# Patient Record
Sex: Male | Born: 2005 | Race: Black or African American | Hispanic: No | Marital: Single | Smoking: Never smoker
Health system: Southern US, Community
[De-identification: ages and names within clinical notes are randomized; demographics above are authoritative.]

---
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
## 2023-10-18 IMAGING — DX DG ANKLE COMPLETE 3+V*L*
3 series · 3 of 3 positions shown · non-contrast
Comparison: Left ankle radiograph 07/08/2015

CLINICAL DATA: Injury, pain. Injured left ankle at work last night,
slipped on water.

EXAM:
LEFT ANKLE COMPLETE - 3+ VIEW

[ankle ap]
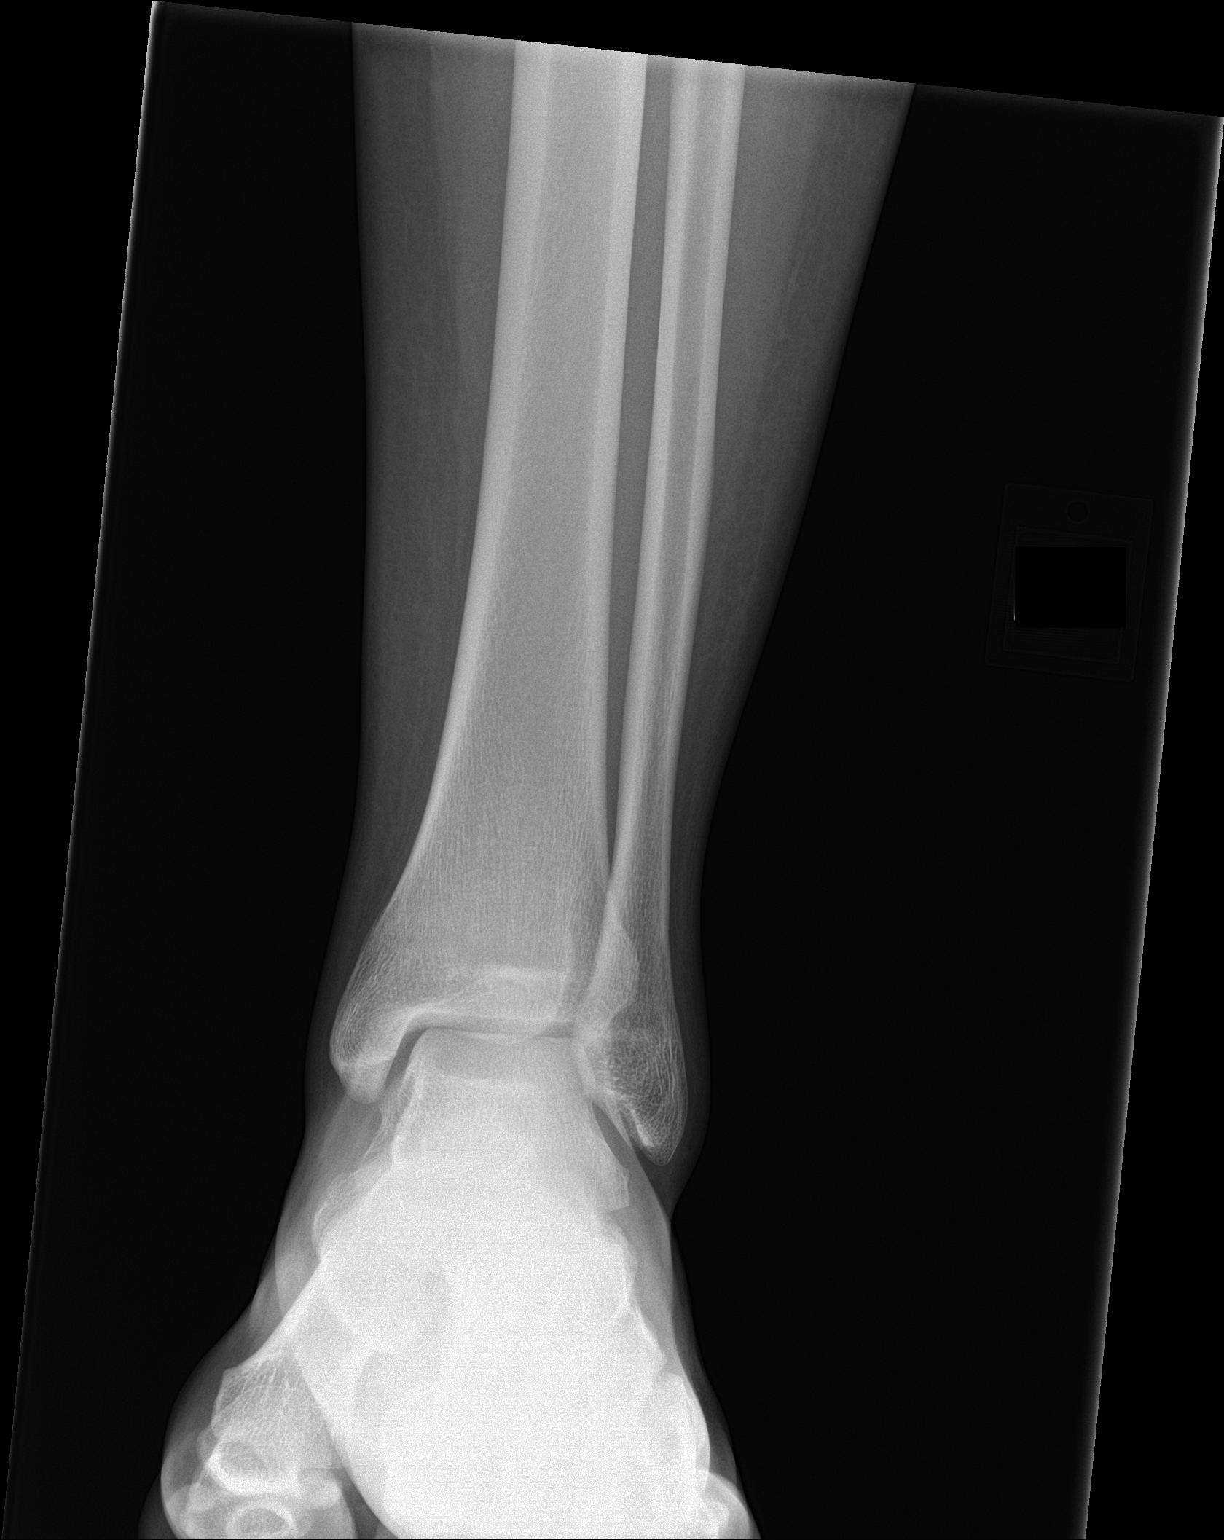

[ankle obl]
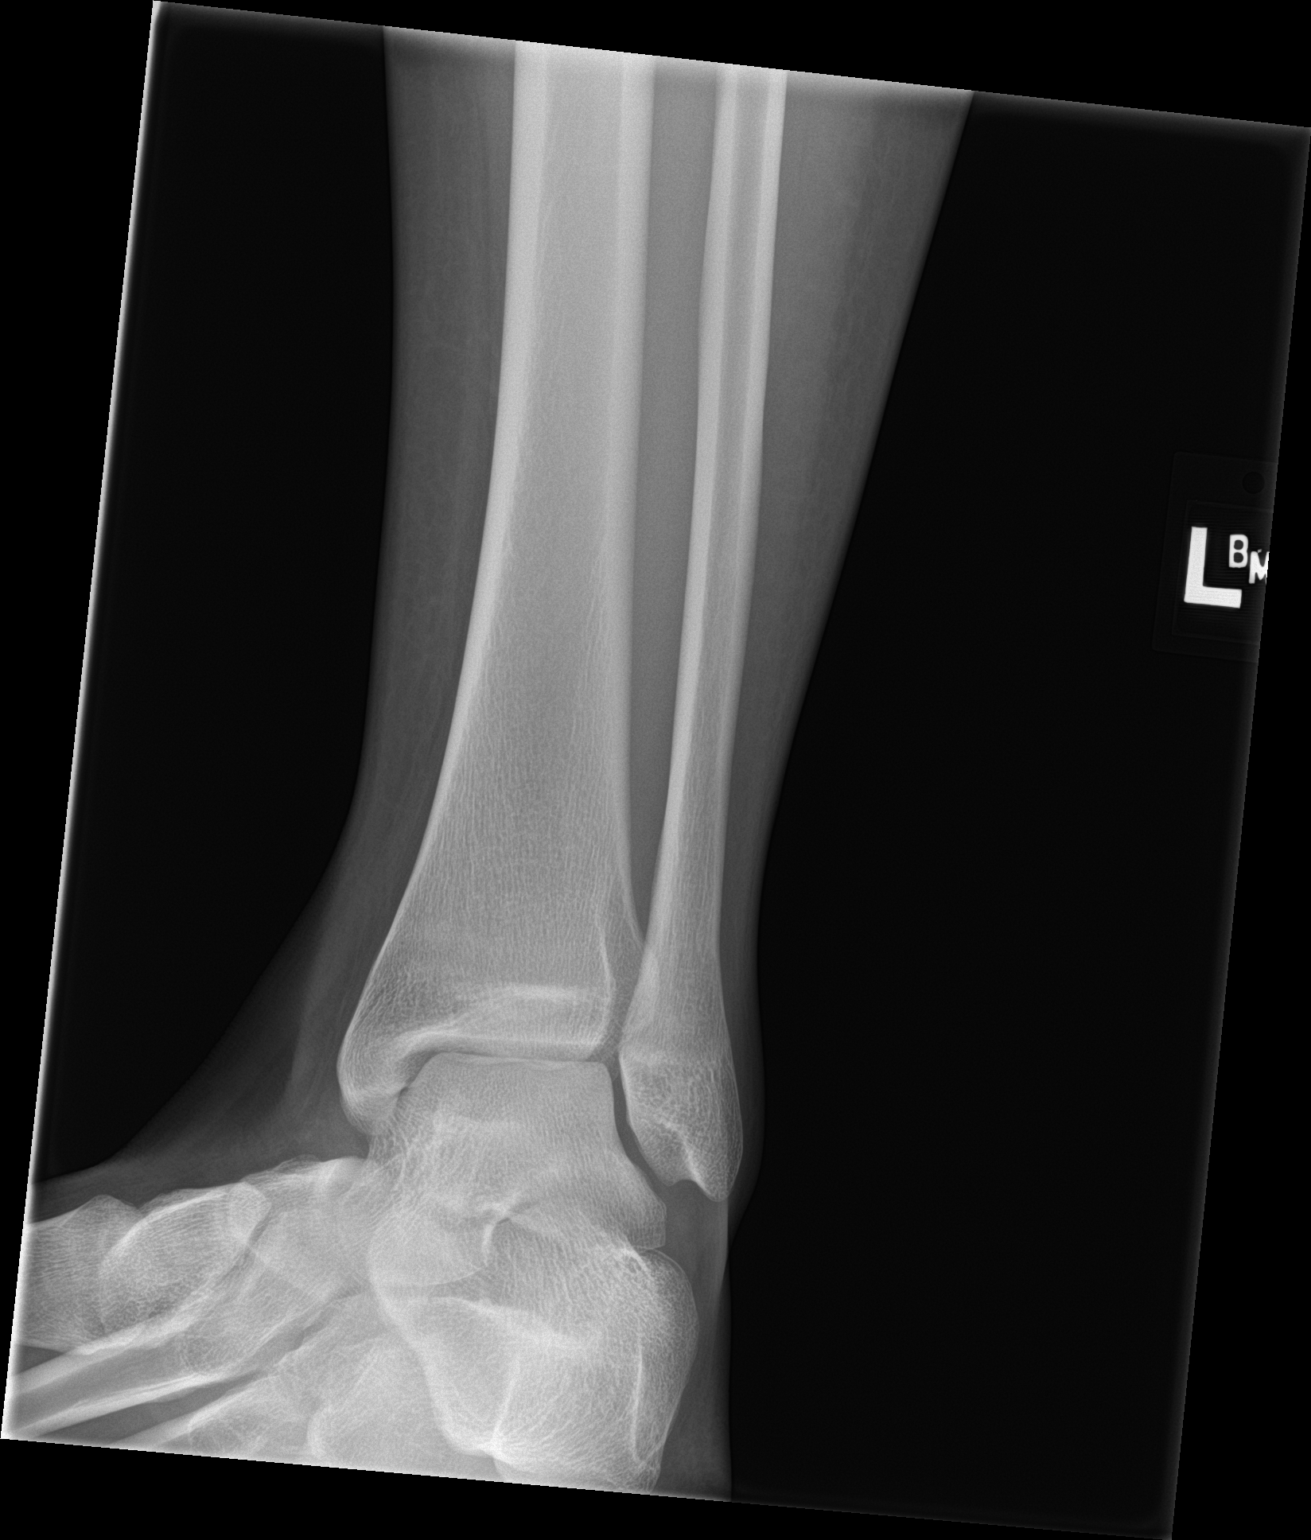

[ankle lat]
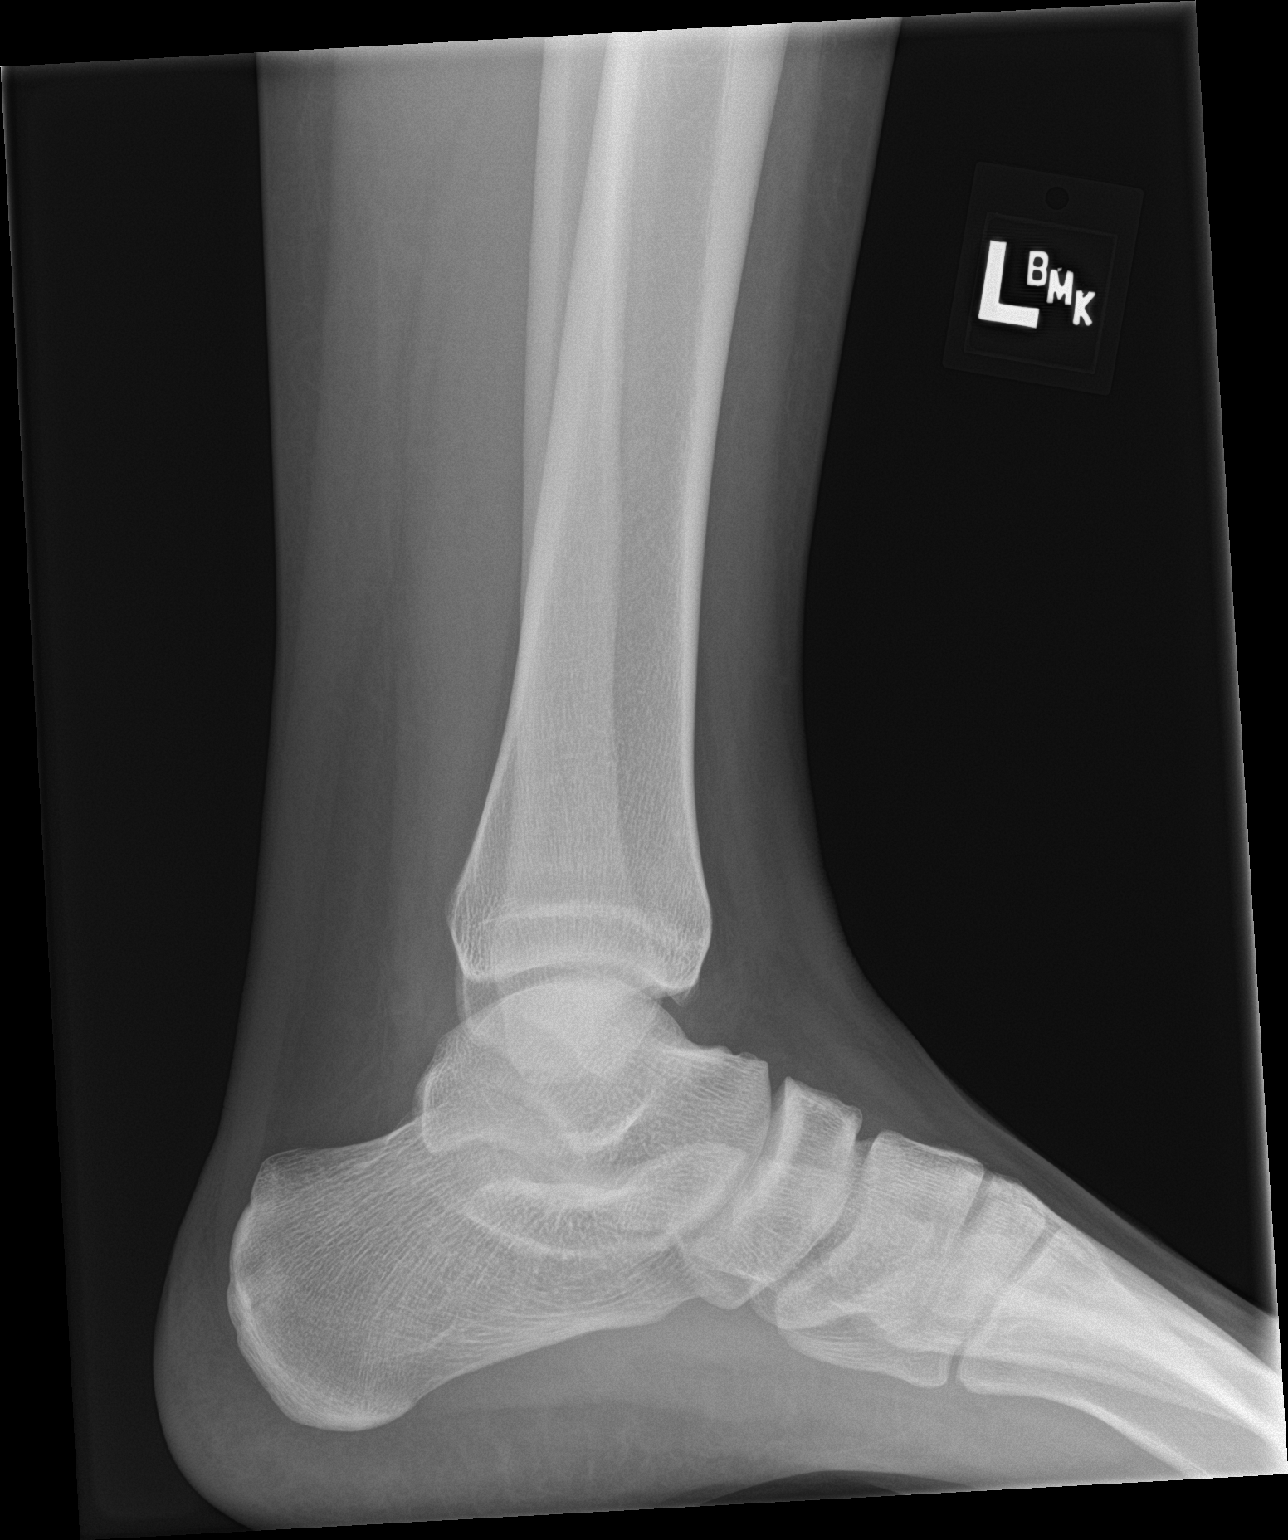

[3 of 3 positions shown; findings below may reference images not displayed]

FINDINGS: There is no evidence of fracture, dislocation, or joint effusion.
The ankle mortise is preserved. Intact talar dome. The growth plates
have fused. There is no evidence of arthropathy or other focal bone
abnormality. Soft tissues are unremarkable.
IMPRESSION: Negative radiographs of the left ankle.
